# Patient Record
Sex: Male | Born: 1990 | Race: White | Hispanic: No | Marital: Single | Smoking: Never smoker
Health system: Southern US, Community
[De-identification: ages and names within clinical notes are randomized; demographics above are authoritative.]

---

## 2019-12-03 ENCOUNTER — Encounter (HOSPITAL_COMMUNITY): Payer: Self-pay

## 2019-12-03 ENCOUNTER — Other Ambulatory Visit: Payer: Self-pay

## 2019-12-03 ENCOUNTER — Emergency Department (HOSPITAL_COMMUNITY): Payer: No Typology Code available for payment source

## 2019-12-03 ENCOUNTER — Emergency Department (HOSPITAL_COMMUNITY)
Admission: EM | Admit: 2019-12-03 | Discharge: 2019-12-03 | Disposition: A | Discharge status: Home / Self Care | Payer: No Typology Code available for payment source | Attending: Emergency Medicine | Admitting: Emergency Medicine

## 2019-12-03 DIAGNOSIS — R519 Headache, unspecified: Secondary | ICD-10-CM | POA: Diagnosis present

## 2019-12-03 DIAGNOSIS — R221 Localized swelling, mass and lump, neck: Secondary | ICD-10-CM | POA: Diagnosis not present

## 2019-12-03 DIAGNOSIS — Z20822 Contact with and (suspected) exposure to covid-19: Secondary | ICD-10-CM | POA: Insufficient documentation

## 2019-12-03 DIAGNOSIS — Z79899 Other long term (current) drug therapy: Secondary | ICD-10-CM | POA: Insufficient documentation

## 2019-12-03 LAB — CBC WITH DIFFERENTIAL/PLATELET
Abs Immature Granulocytes: 0.01 10*3/uL (ref 0.00–0.07)
Basophils Absolute: 0 10*3/uL (ref 0.0–0.1)
Basophils Relative: 1 %
Eosinophils Absolute: 0 10*3/uL (ref 0.0–0.5)
Eosinophils Relative: 0 %
HCT: 44.2 % (ref 39.0–52.0)
Hemoglobin: 14.4 g/dL (ref 13.0–17.0)
Immature Granulocytes: 0 %
Lymphocytes Relative: 28 %
Lymphs Abs: 1.2 10*3/uL (ref 0.7–4.0)
MCH: 28.1 pg (ref 26.0–34.0)
MCHC: 32.6 g/dL (ref 30.0–36.0)
MCV: 86.2 fL (ref 80.0–100.0)
Monocytes Absolute: 0.7 10*3/uL (ref 0.1–1.0)
Monocytes Relative: 17 %
Neutro Abs: 2.2 10*3/uL (ref 1.7–7.7)
Neutrophils Relative %: 54 %
Platelets: 141 10*3/uL — ABNORMAL LOW (ref 150–400)
RBC: 5.13 MIL/uL (ref 4.22–5.81)
RDW: 12.2 % (ref 11.5–15.5)
WBC: 4.1 10*3/uL (ref 4.0–10.5)
nRBC: 0 % (ref 0.0–0.2)

## 2019-12-03 LAB — RESPIRATORY PANEL BY RT PCR (FLU A&B, COVID)
Influenza A by PCR: NEGATIVE
Influenza B by PCR: NEGATIVE
SARS Coronavirus 2 by RT PCR: NEGATIVE

## 2019-12-03 LAB — COMPREHENSIVE METABOLIC PANEL
ALT: 66 U/L — ABNORMAL HIGH (ref 0–44)
AST: 54 U/L — ABNORMAL HIGH (ref 15–41)
Albumin: 4.1 g/dL (ref 3.5–5.0)
Alkaline Phosphatase: 60 U/L (ref 38–126)
Anion gap: 10 (ref 5–15)
BUN: 7 mg/dL (ref 6–20)
CO2: 25 mmol/L (ref 22–32)
Calcium: 9.1 mg/dL (ref 8.9–10.3)
Chloride: 104 mmol/L (ref 98–111)
Creatinine, Ser: 1.03 mg/dL (ref 0.61–1.24)
GFR calc Af Amer: >60 mL/min (ref >60)
GFR calc non Af Amer: >60 mL/min (ref >60)
Glucose, Bld: 104 mg/dL — ABNORMAL HIGH (ref 70–99)
Potassium: 3.8 mmol/L (ref 3.5–5.1)
Sodium: 139 mmol/L (ref 135–145)
Total Bilirubin: 0.8 mg/dL (ref 0.3–1.2)
Total Protein: 7.4 g/dL (ref 6.5–8.1)

## 2019-12-03 MED ORDER — BUTALBITAL-APAP-CAFFEINE 50-325-40 MG PO TABS: 1.0000 | ORAL_TABLET | Freq: Four times a day (QID) | ORAL | 0 refills | Status: DC | PRN

## 2019-12-03 MED ORDER — BUTALBITAL-APAP-CAFFEINE 50-325-40 MG PO TABS: 1.0000 | ORAL_TABLET | Freq: Four times a day (QID) | ORAL | 0 refills | Status: AC | PRN

## 2019-12-03 MED ORDER — SODIUM CHLORIDE (PF) 0.9 % IJ SOLN
INTRAMUSCULAR | Status: AC
  Filled 2019-12-03: qty 50

## 2019-12-03 MED ORDER — DIPHENHYDRAMINE HCL 50 MG/ML IJ SOLN
25.0000 mg | Freq: Once | INTRAMUSCULAR | Status: AC
  Administered 2019-12-03: 25 mg via INTRAVENOUS
  Filled 2019-12-03: qty 1

## 2019-12-03 MED ORDER — PROCHLORPERAZINE EDISYLATE 10 MG/2ML IJ SOLN
10.0000 mg | Freq: Once | INTRAMUSCULAR | Status: AC
  Administered 2019-12-03: 10 mg via INTRAVENOUS
  Filled 2019-12-03: qty 2

## 2019-12-03 MED ORDER — SODIUM CHLORIDE 0.9 % IV BOLUS
1000.0000 mL | Freq: Once | INTRAVENOUS | Status: AC
  Administered 2019-12-03: 1000 mL via INTRAVENOUS

## 2019-12-03 MED ORDER — METOCLOPRAMIDE HCL 5 MG/ML IJ SOLN
10.0000 mg | Freq: Once | INTRAMUSCULAR | Status: AC
  Administered 2019-12-03: 10 mg via INTRAVENOUS
  Filled 2019-12-03: qty 2

## 2019-12-03 MED ORDER — KETOROLAC TROMETHAMINE 30 MG/ML IJ SOLN
30.0000 mg | Freq: Once | INTRAMUSCULAR | Status: AC
  Administered 2019-12-03: 30 mg via INTRAVENOUS
  Filled 2019-12-03: qty 1

## 2019-12-03 MED ORDER — IOHEXOL 350 MG/ML SOLN
80.0000 mL | Freq: Once | INTRAVENOUS | Status: AC | PRN
  Administered 2019-12-03: 100 mL via INTRAVENOUS

## 2019-12-03 NOTE — ED Provider Notes (Signed)
Ct scan reviewed.  Negative for acute process.  Discussed doing and LP with patient.  He declines.  ?viral process, migraine headache.  At this time there does not appear to be any evidence of an acute emergency medical condition and the patient appears stable for discharge with appropriate outpatient follow up.    Linwood Dibbles, MD 12/03/19 [phone]

## 2019-12-03 NOTE — Discharge Instructions (Signed)
Take the medications as needed for your headache.  Follow-up with your primary care doctor or neurologist for further evaluation

## 2019-12-03 NOTE — ED Provider Notes (Signed)
[facility]-EMERGENCY DEPT Provider Note   CSN: [id number] Arrival date & time: 12/03/19  1503     History Chief Complaint  Patient presents with  . Headache    Frank Day is a 29 y.o. male.  Patient sent to the ED 4 days of headache.  Was seen in urgent care today and referred for CT scan.  States he woke up with a severe headache on the morning of March 14.  He denies any fall or trauma.  The headache progressively worsened to the point of involving sensitivity to light, dizziness, nausea.  He developed a fever to 101 on March 16 and was tested for Covid which was negative.  He also had a dry cough at that time.  His significant other thinks the fever was due to his anxiety.  He has not had any fever since.  He still has a dry cough.  He has never been diagnosed with migraines.  He describes diffuse headache that is worse in the front of his head and behind his eyes.  He does have blurry vision and photophobia.  He has pain with eye movement.  No further fever.  Denies any runny nose or sore throat.  Denies any shortness of breath.  Denies abdominal pain.  Had some soreness in his chest earlier which she thinks is due to coughing. Headache is worsening today with multiple episodes of nausea and vomiting, dizziness and lightheadedness. He was told at urgent care that he might have a "mass pressing on my spinal cord" because of a soft tissue mass in his posterior neck that has been there for 3 years.  He reports no acute change in this.  Is not tender to palpation. He has not had this evaluated previously.  No focal weakness, numbness or tingling.  Does have a family history of migraines but never been diagnosed with migraines.  The history is provided by the patient and a relative.  Headache Associated symptoms: congestion, fatigue, fever, myalgias, nausea, photophobia, vomiting and weakness   Associated symptoms: no abdominal pain, no cough and no neck pain         History reviewed. No pertinent past medical history.  There are no problems to display for this patient.   History reviewed. No pertinent surgical history.     History reviewed. No pertinent family history.  Social History   Tobacco Use  . Smoking status: Never Smoker  . Smokeless tobacco: Never Used  Substance Use Topics  . Alcohol use: Yes  . Drug use: Not on file    Home Medications Prior to Admission medications   Medication Sig Start Date End Date Taking? Authorizing Provider  ACETAMINOPHEN-BUTALBITAL 50-325 MG TABS Take 1 tablet by mouth 2 (two) times daily. 12/03/19  Yes [provider]  meloxicam (MOBIC) 7.5 MG tablet Take 7.5 mg by mouth daily. 12/03/19  Yes [provider]  ondansetron (ZOFRAN-ODT) 4 MG disintegrating tablet Take 4 mg by mouth every 8 (eight) hours as needed for nausea or vomiting.  12/03/19  Yes [provider]  phentermine 37.5 MG capsule Take 37.5 mg by mouth every morning.   Yes [provider]    Allergies    Red dye  Review of Systems   Review of Systems  Constitutional: Positive for activity change, appetite change, chills, fatigue and fever.  HENT: Positive for congestion and rhinorrhea. Negative for mouth sores.   Eyes: Positive for photophobia and visual disturbance.  Respiratory: Negative for cough  and shortness of breath.   Cardiovascular: Negative for chest pain.  Gastrointestinal: Positive for nausea and vomiting. Negative for abdominal pain.  Genitourinary: Negative for dysuria and hematuria.  Musculoskeletal: Positive for arthralgias and myalgias. Negative for neck pain.  Skin: Negative for rash.  Neurological: Positive for weakness and headaches.    all other systems are negative except as noted in the HPI and PMH.   Physical Exam Updated Vital Signs BP (!) 151/93 (BP Location: Right Arm)   Pulse 95   Temp 98.4 F (36.9 C) (Oral)   Resp 16   Ht 5\' 9"  (1.753 m)   Wt 117.9 kg    SpO2 98%   BMI 38.40 kg/m   Physical Exam Vitals and nursing note reviewed.  Constitutional:      General: He is not in acute distress.    Appearance: He is well-developed. He is obese. He is not ill-appearing.     Comments: Photophobic, wearing sunglasses  HENT:     Head: Normocephalic and atraumatic.     Right Ear: Tympanic membrane normal.     Left Ear: Tympanic membrane normal.     Nose: Nose normal.     Mouth/Throat:     Pharynx: No oropharyngeal exudate.  Eyes:     Conjunctiva/sclera: Conjunctivae normal.     Pupils: Pupils are equal, round, and reactive to light.  Neck:     Comments: No meningismus Able to range neck completely and touch chin to chest without difficulty.  There is a soft tissue prominence across his posterior neck at approximately C6 and 7.  There is no fluctuance or erythema.  It is nontender.. Cardiovascular:     Rate and Rhythm: Normal rate and regular rhythm.     Heart sounds: Normal heart sounds. No murmur.  Pulmonary:     Effort: Pulmonary effort is normal. No respiratory distress.     Breath sounds: Normal breath sounds.  Chest:     Chest wall: No tenderness.  Abdominal:     Palpations: Abdomen is soft.     Tenderness: There is no abdominal tenderness. There is no guarding or rebound.  Musculoskeletal:        General: No tenderness. Normal range of motion.     Cervical back: Normal range of motion and neck supple.  Skin:    General: Skin is warm.     Capillary Refill: Capillary refill takes less than 2 seconds.  Neurological:     Mental Status: He is alert and oriented to person, place, and time.     Cranial Nerves: No cranial nerve deficit.     Motor: No abnormal muscle tone.     Coordination: Coordination normal.     Comments: No ataxia on finger to nose bilaterally. No pronator drift. 5/5 strength throughout. CN 2-12 intact.Equal grip strength. Sensation intact.   Psychiatric:        Behavior: Behavior normal.     ED Results /  Procedures / Treatments   Labs (all labs ordered are listed, but only abnormal results are displayed) Labs Reviewed  CBC WITH DIFFERENTIAL/PLATELET - Abnormal; Notable for the following components:      Result Value   Platelets 141 (*)    All other components within normal limits  COMPREHENSIVE METABOLIC PANEL - Abnormal; Notable for the following components:   Glucose, Bld 104 (*)    AST 54 (*)    ALT 66 (*)    All other components within normal limits  RESPIRATORY PANEL BY  RT PCR (FLU A&B, COVID)    EKG None  Radiology No results found.  Procedures Procedures (including critical care time)  Medications Ordered in ED Medications  sodium chloride 0.9 % bolus 1,000 mL (has no administration in time range)  metoCLOPramide (REGLAN) injection 10 mg (has no administration in time range)  diphenhydrAMINE (BENADRYL) injection 25 mg (has no administration in time range)    ED Course  I have reviewed the triage vital signs and the nursing notes.  Pertinent labs & imaging results that were available during my care of the patient were reviewed by me and considered in my medical decision making (see chart for details).    MDM Rules/Calculators/A&P                      Patient with 4 days of headache with progressively worsening.  Associated with photophobia, dizziness, nausea and vomiting and blurred vision.  No focal neurological deficit.  Did have fever 2 days ago but none since.  Neurologically intact.  No meningismus. Soft tissue abnormality to posterior neck which patient has says has been there for 3 years that is nontender.  Doubt this is the source of his headache and neck pain.  Patient is afebrile. He refuses rectal temperature.  Discussed with patient that bacterial meningitis seems unlikely though though viral meningitis may still be possible would not change management.  Low suspicion clinically for bacterial meningitis. He is uncertain that he would want lumbar  puncture.  CT head will be obtained given lack of imaging in the system. We will also obtain imaging of his neck to evaluate the soft tissue lesion.  Doubt this is causing his headache.  Patient afebrile.  He is given Reglan and Benadryl.  Holding Toradol while CT scan is pending.  Care to be transferred at shift change to Dr. Lynelle Doctor. Patient aware that lumbar puncture may be necessary to rule out viral meningitis but it does not seem likely that he has bacterial meningitis given his well appearance. He agrees to wait for CT results for more information.  Frank Day was evaluated in Emergency Department on 12/03/2019 for the symptoms described in the history of present illness. He was evaluated in the context of the global COVID-19 pandemic, which necessitated consideration that the patient might be at risk for infection with the SARS-CoV-2 virus that causes COVID-19. Institutional protocols and algorithms that pertain to the evaluation of patients at risk for COVID-19 are in a state of rapid change based on information released by regulatory bodies including the CDC and federal and state organizations. These policies and algorithms were followed during the patient's care in the ED.  Final Clinical Impression(s) / ED Diagnoses Final diagnoses:  Bad headache    Rx / DC Orders ED Discharge Orders    None       Nilay Mangrum, Jeannett Senior, MD 12/03/19 1719

## 2019-12-03 NOTE — ED Notes (Signed)
Pt lying in bed. Family at bedside. NAD noted. Pt ready for d/c.

## 2019-12-03 NOTE — ED Triage Notes (Signed)
Patient states he developed a headache 4 days ago and went to [facility] today. Patient states he was sent to the ED for a possible CT due to a raised area on the patient's upper back. Patient c/o blurred vision, dizziness, and sensitivity to light. Patient states he had a panic attack with nausea early this AM.

## 2019-12-03 NOTE — ED Notes (Signed)
Pt requesting update and pain meds. MD aware.

## 2019-12-03 NOTE — ED Notes (Signed)
ED Provider at bedside. 

## 2021-04-12 IMAGING — CT CT ANGIO HEAD
1 of 10 series · 6 of 33 positions shown · IV contrast (APPLIED)
Comparison: None.

CLINICAL DATA: Headache beginning about 4 days ago. Blurred vision
and dizziness.

EXAM:
CT ANGIOGRAPHY HEAD AND NECK
TECHNIQUE: Multidetector CT imaging of the head and neck was performed using
the standard protocol during bolus administration of intravenous
contrast. Multiplanar CT image reconstructions and MIPs were
obtained to evaluate the vascular anatomy. Carotid stenosis
measurements (when applicable) are obtained utilizing NASCET
criteria, using the distal internal carotid diameter as the
denominator.
CONTRAST:  100mL OMNIPAQUE IOHEXOL 350 MG/ML SOLN

[Series 8: ax thin · axial · 0.39mm/px · z∈[-360,-101]mm · 6 of 363 slices shown]
[im 52/363  soft-tissue]
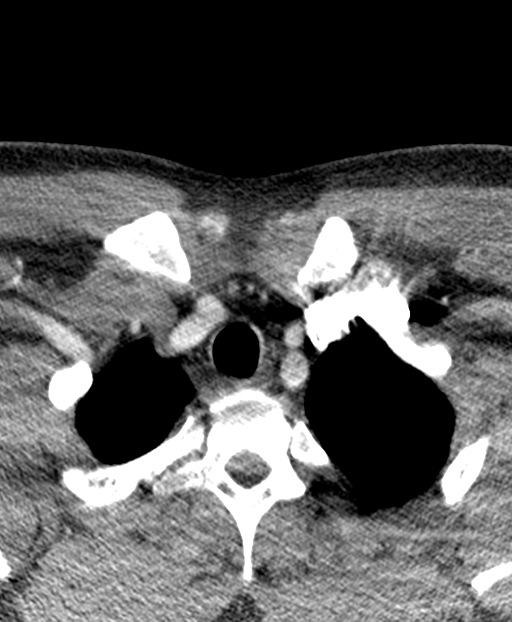
[im 104/363  bone]
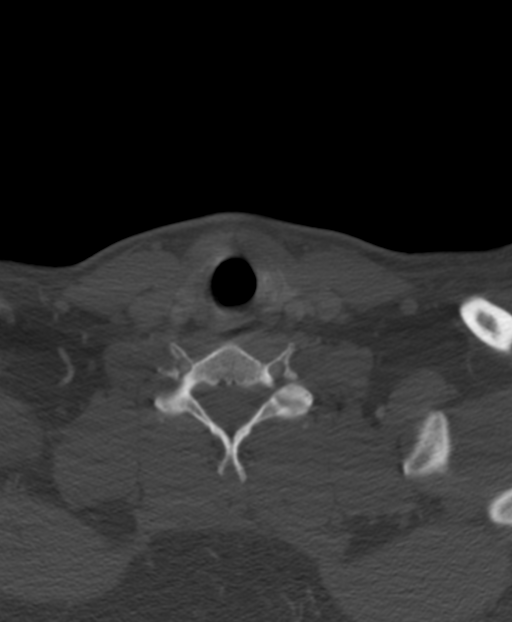
[im 156/363  soft-tissue]
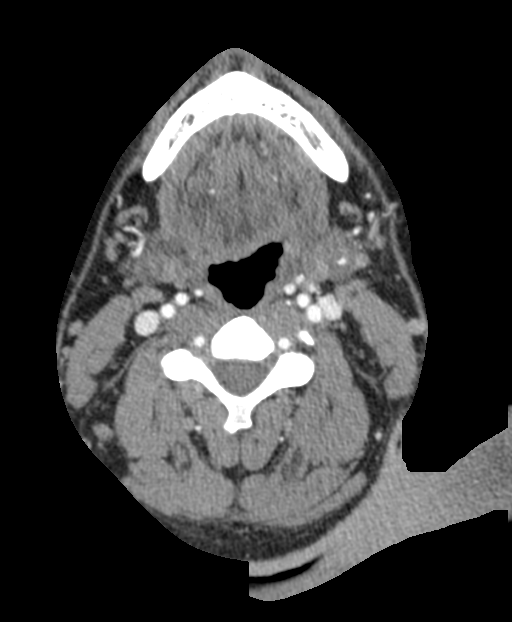
[im 207/363  bone]
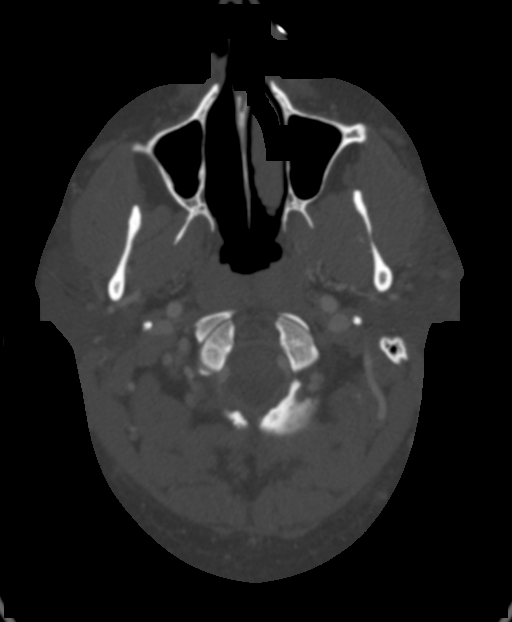
[im 259/363  soft-tissue]
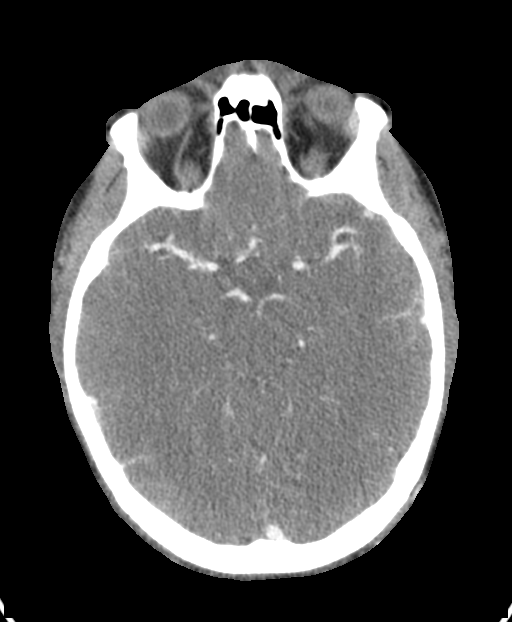
[im 311/363  bone]
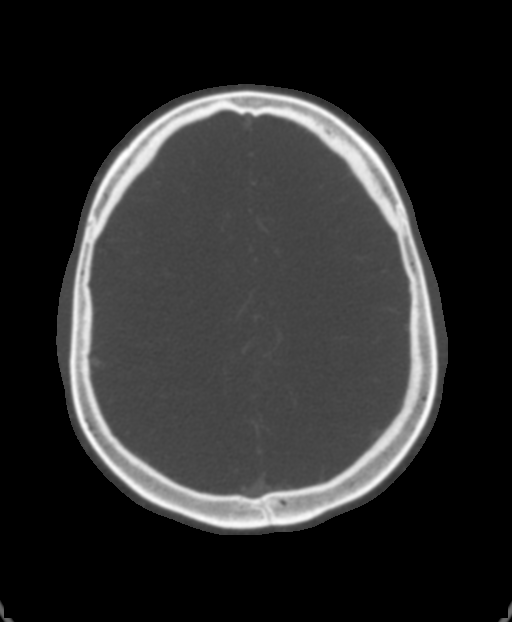

[6 of 33 positions shown; findings below may reference images not displayed]

FINDINGS: CT HEAD FINDINGS

Brain: The brain shows a normal appearance without evidence of
malformation, atrophy, old or acute small or large vessel
infarction, mass lesion, hemorrhage, hydrocephalus or extra-axial
collection.

Vascular: No hyperdense vessel. No evidence of atherosclerotic
calcification.

Skull: Normal.  No traumatic finding.  No focal bone lesion.

Sinuses/Orbits: Sinuses are clear. Orbits appear normal. Mastoids
are clear.

Other: None significant

CTA NECK FINDINGS

Aortic arch: Normal

Right carotid system: Some motion degradation but normal allowing
for that. No evidence of dissection or other vascular pathology.

Left carotid system: Some motion degradation but normal allowing for
that. No evidence of dissection or other vascular pathology.

Vertebral arteries: Some motion degradation but normal allowing for
that. No evidence of dissection or other vascular pathology.

Skeleton: Normal

Other neck: Normal

Upper chest: Normal

Review of the MIP images confirms the above findings

CTA HEAD FINDINGS

Anterior circulation: Both internal carotid arteries are patent
through the skull base and siphon regions. The anterior and middle
cerebral vessels appear normal without large or medium vessel
occlusion, stenosis, aneurysm or vascular malformation.

Posterior circulation: Both vertebral arteries are patent through
the foramen magnum to the basilar. No basilar or posterior
circulation branch vessel pathology evident.

Venous sinuses: Patent and normal.

Anatomic variants: None significant.

Review of the MIP images confirms the above findings
IMPRESSION: Normal head CT.

Motion degraded study, but allowing for that unremarkable appearing
CT angiography of the neck and head.
